# Patient Record
Sex: Female | Born: 1955 | Race: White | Hispanic: No | Marital: Married | Smoking: Former smoker
Health system: Southern US, Community
[De-identification: ages and names within clinical notes are randomized; demographics above are authoritative.]

## PROBLEM LIST (undated history)

## (undated) DIAGNOSIS — C801 Malignant (primary) neoplasm, unspecified: Secondary | ICD-10-CM

---

## 2013-05-09 ENCOUNTER — Emergency Department (HOSPITAL_BASED_OUTPATIENT_CLINIC_OR_DEPARTMENT_OTHER)
Admission: EM | Admit: 2013-05-09 | Discharge: 2013-05-09 | Disposition: A | Discharge status: Home / Self Care | Payer: Federal, State, Local not specified - PPO | Attending: Emergency Medicine | Admitting: Emergency Medicine

## 2013-05-09 ENCOUNTER — Encounter (HOSPITAL_BASED_OUTPATIENT_CLINIC_OR_DEPARTMENT_OTHER): Payer: Self-pay | Admitting: Emergency Medicine

## 2013-05-09 ENCOUNTER — Emergency Department (HOSPITAL_BASED_OUTPATIENT_CLINIC_OR_DEPARTMENT_OTHER): Payer: Federal, State, Local not specified - PPO

## 2013-05-09 DIAGNOSIS — J159 Unspecified bacterial pneumonia: Secondary | ICD-10-CM | POA: Insufficient documentation

## 2013-05-09 DIAGNOSIS — Z85819 Personal history of malignant neoplasm of unspecified site of lip, oral cavity, and pharynx: Secondary | ICD-10-CM | POA: Insufficient documentation

## 2013-05-09 DIAGNOSIS — J189 Pneumonia, unspecified organism: Secondary | ICD-10-CM

## 2013-05-09 DIAGNOSIS — Z87891 Personal history of nicotine dependence: Secondary | ICD-10-CM | POA: Insufficient documentation

## 2013-05-09 DIAGNOSIS — Z853 Personal history of malignant neoplasm of breast: Secondary | ICD-10-CM | POA: Insufficient documentation

## 2013-05-09 HISTORY — DX: Malignant (primary) neoplasm, unspecified: C80.1

## 2013-05-09 LAB — CBC WITH DIFFERENTIAL/PLATELET
BASOS ABS: 0 10*3/uL (ref 0.0–0.1)
Basophils Relative: 0 % (ref 0–1)
EOS PCT: 1 % (ref 0–5)
Eosinophils Absolute: 0.1 10*3/uL (ref 0.0–0.7)
HCT: 34.5 % — ABNORMAL LOW (ref 36.0–46.0)
Hemoglobin: 11.5 g/dL — ABNORMAL LOW (ref 12.0–15.0)
LYMPHS ABS: 1.5 10*3/uL (ref 0.7–4.0)
LYMPHS PCT: 15 % (ref 12–46)
MCH: 29.8 pg (ref 26.0–34.0)
MCHC: 33.3 g/dL (ref 30.0–36.0)
MCV: 89.4 fL (ref 78.0–100.0)
Monocytes Absolute: 1 10*3/uL (ref 0.1–1.0)
Monocytes Relative: 10 % (ref 3–12)
NEUTROS ABS: 7.4 10*3/uL (ref 1.7–7.7)
Neutrophils Relative %: 74 % (ref 43–77)
PLATELETS: 251 10*3/uL (ref 150–400)
RBC: 3.86 MIL/uL — AB (ref 3.87–5.11)
RDW: 13.5 % (ref 11.5–15.5)
WBC: 9.9 10*3/uL (ref 4.0–10.5)

## 2013-05-09 LAB — COMPREHENSIVE METABOLIC PANEL
ALK PHOS: 94 U/L (ref 39–117)
ALT: 19 U/L (ref 0–35)
AST: 32 U/L (ref 0–37)
Albumin: 4 g/dL (ref 3.5–5.2)
BILIRUBIN TOTAL: 0.4 mg/dL (ref 0.3–1.2)
BUN: 20 mg/dL (ref 6–23)
CALCIUM: 10.1 mg/dL (ref 8.4–10.5)
CHLORIDE: 97 meq/L (ref 96–112)
CO2: 26 meq/L (ref 19–32)
Creatinine, Ser: 1.1 mg/dL (ref 0.50–1.10)
GFR calc non Af Amer: 54 mL/min — ABNORMAL LOW (ref >90)
GFR, EST AFRICAN AMERICAN: 63 mL/min — AB (ref >90)
Glucose, Bld: 96 mg/dL (ref 70–99)
POTASSIUM: 4.5 meq/L (ref 3.7–5.3)
Sodium: 139 mEq/L (ref 137–147)
Total Protein: 7.8 g/dL (ref 6.0–8.3)

## 2013-05-09 LAB — TROPONIN I
Troponin I: <0.3 ng/mL (ref <0.30)
Troponin I: <0.3 ng/mL (ref <0.30)

## 2013-05-09 LAB — D-DIMER, QUANTITATIVE: D-Dimer, Quant: <0.27 ug/mL-FEU (ref 0.00–0.48)

## 2013-05-09 MED ORDER — ACETAMINOPHEN 325 MG PO TABS: 650.0000 mg | ORAL_TABLET | Freq: Four times a day (QID) | ORAL | Status: AC | PRN

## 2013-05-09 MED ORDER — LEVOFLOXACIN 750 MG PO TABS
ORAL_TABLET | ORAL | Status: AC
  Filled 2013-05-09: qty 1

## 2013-05-09 MED ORDER — SODIUM CHLORIDE 0.9 % IV BOLUS (SEPSIS)
1000.0000 mL | Freq: Once | INTRAVENOUS | Status: AC
  Administered 2013-05-09: 1000 mL via INTRAVENOUS

## 2013-05-09 MED ORDER — LEVOFLOXACIN IN D5W 750 MG/150ML IV SOLN: 750.0000 mg | Freq: Once | INTRAVENOUS | Status: DC

## 2013-05-09 MED ORDER — IOHEXOL 350 MG/ML SOLN
100.0000 mL | Freq: Once | INTRAVENOUS | Status: AC | PRN
Start: 2013-05-09 — End: 2013-05-09
  Administered 2013-05-09: 100 mL via INTRAVENOUS

## 2013-05-09 MED ORDER — LEVOFLOXACIN 750 MG PO TABS
750.0000 mg | ORAL_TABLET | Freq: Once | ORAL | Status: AC
  Administered 2013-05-09: 750 mg via ORAL

## 2013-05-09 MED ORDER — SODIUM CHLORIDE 0.9 % IV BOLUS (SEPSIS): 1000.0000 mL | Freq: Once | INTRAVENOUS | Status: DC

## 2013-05-09 MED ORDER — LEVOFLOXACIN 500 MG PO TABS: 500.0000 mg | ORAL_TABLET | Freq: Every day | ORAL | Status: AC

## 2013-05-09 MED ORDER — ASPIRIN 325 MG PO TABS
325.0000 mg | ORAL_TABLET | Freq: Once | ORAL | Status: AC
  Administered 2013-05-09: 325 mg via ORAL
  Filled 2013-05-09: qty 1

## 2013-05-09 NOTE — ED Notes (Signed)
States has felt achy all day especially in left rib cage area and says felt like she was getting the flu or something. Was driving home from work and began having pain on left side chest and feeling like she cant get a deep breath

## 2013-05-09 NOTE — ED Provider Notes (Signed)
CSN: [id number]     Arrival date & time 05/09/13  1229 History   First MD Initiated Contact with Patient 05/09/13 1230     Chief Complaint  Patient presents with  . Chest Pain  . Shortness of Breath     (Consider location/radiation/quality/duration/timing/severity/associated sxs/prior Treatment) The history is provided by the patient. No language interpreter was used.  Vicki Smith is a 58 y/o F with PMHx of breast cancer 10 years ago with chemotherapy and right mastectomy, throat cancer 2 years ago with radiation therapy presenting to the ED with chest pain that started suddenly while on her way home from work. Patient reported that the chest pain started to get progressively worse - stated that the pain worsens with deep inhalation. Described the discomfort as a stabbing pain localized to the left side of her chest without radiation. Stated that she felt fine prior to this afternoon. Denied history of HTN and DM. Reported that she quite smoking cigarettes 10 years ago. Denied fever, chills, neck pain, neck stiffness, nausea, vomiting, abdominal pain, jaw pain, blurred vision, sudden loss of vision, back pain.  PCP none   Past Medical History  Diagnosis Date  . Cancer     breast and throat   History reviewed. No pertinent past surgical history. History reviewed. No pertinent family history. History  Substance Use Topics  . Smoking status: Former Research scientist (life sciences)  . Smokeless tobacco: Not on file  . Alcohol Use: No   OB History   Grav Para Term Preterm Abortions TAB SAB Ect Mult Living                 Review of Systems  Constitutional: Negative for fever and chills.  Respiratory: Positive for shortness of breath. Negative for chest tightness.   Cardiovascular: Positive for chest pain.  Gastrointestinal: Negative for nausea, vomiting and abdominal pain.  Musculoskeletal: Negative for neck pain and neck stiffness.      Allergies  Review of patient's allergies indicates no known  allergies.  Home Medications   Prior to Admission medications   Not on File   BP 152/82  Pulse 69  Temp(Src) 100.8 F (38.2 C) (Oral)  Resp 20  Ht 5\' 10"  (1.778 m)  Wt 115 lb (52.164 kg)  BMI 16.50 kg/m2  SpO2 100% Physical Exam  Nursing note and vitals reviewed. Constitutional: She is oriented to person, place, and time. She appears well-developed and well-nourished. No distress.  Patient appears anxious and is tearful  HENT:  Mouth/Throat: Oropharynx is clear and moist. No oropharyngeal exudate.  Eyes: Conjunctivae and EOM are normal. Pupils are equal, round, and reactive to light. Right eye exhibits no discharge. Left eye exhibits no discharge.  Neck: Normal range of motion. Neck supple. No tracheal deviation present.  Cardiovascular: Normal rate, regular rhythm and normal heart sounds.  Exam reveals no friction rub.   No murmur heard. Cap refill < 3 seconds  Negative swelling or pitting edema localized to the lower extremities bilaterally   Pulmonary/Chest: Effort normal and breath sounds normal. No respiratory distress. She has no wheezes. She has no rales. She exhibits tenderness.  Patient is able to speak in full sentences without difficulty  Negative use of accessory muscles Negative stridor  Right breast mastectomy noted Discomfort upon palpation to the chest wall - left side - reproducible upon palpation    Musculoskeletal: Normal range of motion.  Full ROM to upper and lower extremities without difficulty noted, negative ataxia noted.  Lymphadenopathy:  She has no cervical adenopathy.  Neurological: She is alert and oriented to person, place, and time. No cranial nerve deficit. She exhibits normal muscle tone. Coordination normal.  Cranial nerves III-XII grossly intact Strength 5+/5+ to upper and lower extremities bilaterally with resistance applied, equal distribution noted  Skin: Skin is warm and dry. No rash noted. She is not diaphoretic. No erythema.   Psychiatric: She has a normal mood and affect. Her behavior is normal. Thought content normal.    ED Course  Procedures (including critical care time)  Results for orders placed during the hospital encounter of 05/09/13  CBC WITH DIFFERENTIAL      Result Value Ref Range   WBC 9.9  4.0 - 10.5 K/uL   RBC 3.86 (*) 3.87 - 5.11 MIL/uL   Hemoglobin 11.5 (*) 12.0 - 15.0 g/dL   HCT 34.5 (*) 36.0 - 46.0 %   MCV 89.4  78.0 - 100.0 fL   MCH 29.8  26.0 - 34.0 pg   MCHC 33.3  30.0 - 36.0 g/dL   RDW 13.5  11.5 - 15.5 %   Platelets 251  150 - 400 K/uL   Neutrophils Relative % 74  43 - 77 %   Neutro Abs 7.4  1.7 - 7.7 K/uL   Lymphocytes Relative 15  12 - 46 %   Lymphs Abs 1.5  0.7 - 4.0 K/uL   Monocytes Relative 10  3 - 12 %   Monocytes Absolute 1.0  0.1 - 1.0 K/uL   Eosinophils Relative 1  0 - 5 %   Eosinophils Absolute 0.1  0.0 - 0.7 K/uL   Basophils Relative 0  0 - 1 %   Basophils Absolute 0.0  0.0 - 0.1 K/uL  COMPREHENSIVE METABOLIC PANEL      Result Value Ref Range   Sodium 139  137 - 147 mEq/L   Potassium 4.5  3.7 - 5.3 mEq/L   Chloride 97  96 - 112 mEq/L   CO2 26  19 - 32 mEq/L   Glucose, Bld 96  70 - 99 mg/dL   BUN 20  6 - 23 mg/dL   Creatinine, Ser 1.10  0.50 - 1.10 mg/dL   Calcium 10.1  8.4 - 10.5 mg/dL   Total Protein 7.8  6.0 - 8.3 g/dL   Albumin 4.0  3.5 - 5.2 g/dL   AST 32  0 - 37 U/L   ALT 19  0 - 35 U/L   Alkaline Phosphatase 94  39 - 117 U/L   Total Bilirubin 0.4  0.3 - 1.2 mg/dL   GFR calc non Af Amer 54 (*) >90 mL/min   GFR calc Af Amer 63 (*) >90 mL/min  TROPONIN I      Result Value Ref Range   Troponin I <0.30  <0.30 ng/mL  D-DIMER, QUANTITATIVE      Result Value Ref Range   D-Dimer, Quant <0.27  0.00 - 0.48 ug/mL-FEU  TROPONIN I      Result Value Ref Range   Troponin I <0.30  <0.30 ng/mL    Labs Review Labs Reviewed  CBC WITH DIFFERENTIAL - Abnormal; Notable for the following:    RBC 3.86 (*)    Hemoglobin 11.5 (*)    HCT 34.5 (*)    All other  components within normal limits  COMPREHENSIVE METABOLIC PANEL - Abnormal; Notable for the following:    GFR calc non Af Amer 54 (*)    GFR calc Af Amer 63 (*)  All other components within normal limits  TROPONIN I  D-DIMER, QUANTITATIVE  TROPONIN I    Imaging Review Dg Chest 2 View  05/09/2013   CLINICAL DATA:  58 year old female sudden onset chest pain and shortness of Breath while driving. Initial encounter. History of breast and throat cancer status post mastectomy and radiation. Initial encounter.  EXAM: CHEST  2 VIEW  COMPARISON:  [facility] chest CT 02/11/2007.  FINDINGS: Lung volumes are at the upper limits of normal. Pectus excavatum again noted. Normal cardiac size and mediastinal contours. Visualized tracheal air column is within normal limits.  Interval surgery to the right chest wall. There is a chronic posterior lateral right sixth rib fracture. Bilateral axilla postoperative changes with small surgical clips. No acute osseous abnormality identified.  There are multiple nodular densities in the left lung, the larger measuring about 3 cm projects just lateral and inferior to the left hilum (double arrows). There are also biapical nodular changes, greater on the left (2 cm, arrow). These findings are not evident on the lateral view. Questionable additional right lower lobe peribronchovascular nodularity (small arrow). No pneumothorax or edema. No pleural effusion.  IMPRESSION: 1. Left greater than right multifocal nodular opacity in the lungs, new since 2009. Recommend followup chest CT (IV contrast preferred). 2. No other acute cardiopulmonary abnormality identified.   Electronically Signed   By: Lars Pinks M.D.   On: 05/09/2013 13:30   Ct Chest W Contrast  05/09/2013   CLINICAL DATA:  Chest pain and shortness of breath. New multifocal nodular opacities on chest radiographs. History of breast cancer and throat cancer.  EXAM: CT CHEST WITH CONTRAST  TECHNIQUE:  Multidetector CT imaging of the chest was performed during intravenous contrast administration.  CONTRAST:  126mL OMNIPAQUE IOHEXOL 350 MG/ML SOLN  COMPARISON:  Chest radiographs 05/09/2013 and CT 02/11/2007  FINDINGS: Sequelae of bilateral mastectomies and axillary dissections are identified. There is marked pectus excavatum deformity. No enlarged axillary, mediastinal, or hilar lymph nodes are identified. Thoracic aorta is normal in caliber. Heart is normal in size. There is no pleural or pericardial effusion.  There is new consolidative and ground-glass opacity in the lung apices, left greater than right. There is a 6 mm ground-glass nodular opacity along the right minor fissure (image 25). 1.4 cm nodular opacity is present in the basilar right lower lobe (image 50). 3 mm nodule in the left upper lobe is unchanged (image 23). There are scattered peribronchovascular ground-glass and consolidative opacities bilaterally, with greatest involvement of the lower lobes and lingula.  Visualized portion of the upper abdomen is unremarkable. No acute osseous abnormality is identified. Old right lateral sixth rib fracture is noted.  IMPRESSION: New bilateral peribronchovascular opacities, most concerning for acute multifocal pneumonia. Follow-up chest CT is recommended in 4 weeks following treatment to assess for resolution.   Electronically Signed   By: Logan Bores   On: 05/09/2013 15:44     EKG Interpretation   Date/Time:  Friday May 09 2013 12:33:39 EDT Ventricular Rate:  88 PR Interval:  168 QRS Duration: 100 QT Interval:  358 QTC Calculation: 433 R Axis:   104 Text Interpretation:  Normal sinus rhythm Biatrial enlargement Rightward  axis Incomplete right bundle branch block Possible Anterior infarct , age  undetermined No previous tracing Confirmed by Maryan Rued  MD, WHITNEY  [phone]) on 05/09/2013 12:40:35 PM      MDM   Final diagnoses:  Community acquired pneumonia   Medications  aspirin tablet  325  mg (325 mg Oral Given 05/09/13 1244)  sodium chloride 0.9 % bolus 1,000 mL (0 mLs Intravenous Stopped 05/09/13 1610)  iohexol (OMNIPAQUE) 350 MG/ML injection 100 mL (100 mLs Intravenous Contrast Given 05/09/13 1519)  levofloxacin (LEVAQUIN) tablet 750 mg (750 mg Oral Given 05/09/13 1759)   Filed Vitals:   05/09/13 1320 05/09/13 1327 05/09/13 1800 05/09/13 1803  BP:  107/62 152/82   Pulse: 74 73 69   Temp:      TempSrc:      Resp: 20 23 20    Height:      Weight:      SpO2: 100% 90% 100% 100%    EKG noted normal sinus rhythm with a heart rate of 88 bpm - bilateral enlargement noted with right axis deviation with an incomplete RBBB - no previous tracings noted. First troponin negative elevation. Second troponin negative elevation. D-dimer negative elevation. CMP negative findings - kidney and liver functioning well. Chest xray noted left greater than right multifocal nodular opacity in the lungs, new since 2009 - CT chest with contrast noted. CT chest with contrast noted new bilateral peribronchial vascular opacities are most concerning for acute multifocal pneumonia. Upon arrival to the ED patient was mildly afebrile - 100.65F. IV fluids, by mouth Tylenol and by mouth antibiotics administered in ED setting as per patient's request. Patient appears well. Negative signs of sepsis-negative elevated white blood cell count leukocytosis, negative tachycardia, negative tachypnea, pulse ox 100% on room air and with ambulation, negative drop in blood pressure. Patient stable, afebrile. Patient not septic appearing. No sign of respiratory distress. Patient denied chest pain, shortness of breath, difficulty breathing while in the ED setting. Curb 65 1 - low risk. PORT score 48 - outpatient treatment reasonable. Discharged patient. Discharged patient with antibiotics and Tylenol for fever control. Discussed with patient that CT chest with contrast is to be repeated within 4 weeks as per requested from today's CT of  chest. Discussed with patient to rest and stay hydrated. Discussed with patient to avoid any physical or strenuous activity. Referred patient to PCP to be reassessed. Discussed with patient to closely monitor symptoms and if symptoms are to worsen or change to report back to the ED - strict return instructions given.  Patient agreed to plan of care, understood, all questions answered.   Jamse Mead, PA-C 05/10/13 1341  Eliberto Sole, PA-C 05/11/13 2016

## 2013-05-09 NOTE — Discharge Instructions (Signed)
Please call your doctor for a followup appointment within 24-48 hours. When you talk to your doctor please let them know that you were seen in the emergency department and have them acquire all of your records so that they can discuss the findings with you and formulate a treatment plan to fully care for your new and ongoing problems. Please call and 7 appointment with your primary care provider to be reassessed within the next week For fever control please use Tylenol as prescribed-no more than 4000 mg/4 g per day for this can lead to liver issues and Tylenol overdose they can be fatal CT chest will need to be performed within 4 weeks Please drink plenty of water Please rest Please continue monitor symptoms closely and if symptoms are to worsen or change (fever greater than 121, chills, chest pain, shortness of breath, difficulty breathing, worsening or changes to chest pain, fainting, weakness, blood in the sputum) please report back to the ED immediately  Pneumonia, Adult Pneumonia is an infection of the lungs.  CAUSES Pneumonia may be caused by bacteria or a virus. Usually, these infections are caused by breathing infectious particles into the lungs (respiratory tract). SYMPTOMS   Cough.  Fever.  Chest pain.  Increased rate of breathing.  Wheezing.  Mucus production. DIAGNOSIS  If you have the common symptoms of pneumonia, your caregiver will typically confirm the diagnosis with a chest X-ray. The X-ray will show an abnormality in the lung (pulmonary infiltrate) if you have pneumonia. Other tests of your blood, urine, or sputum may be done to find the specific cause of your pneumonia. Your caregiver may also do tests (blood gases or pulse oximetry) to see how well your lungs are working. TREATMENT  Some forms of pneumonia may be spread to other people when you cough or sneeze. You may be asked to wear a mask before and during your exam. Pneumonia that is caused by bacteria is treated  with antibiotic medicine. Pneumonia that is caused by the influenza virus may be treated with an antiviral medicine. Most other viral infections must run their course. These infections will not respond to antibiotics.  PREVENTION A pneumococcal shot (vaccine) is available to prevent a common bacterial cause of pneumonia. This is usually suggested for:  People over 78 years old.  Patients on chemotherapy.  People with chronic lung problems, such as bronchitis or emphysema.  People with immune system problems. If you are over 65 or have a high risk condition, you may receive the pneumococcal vaccine if you have not received it before. In some countries, a routine influenza vaccine is also recommended. This vaccine can help prevent some cases of pneumonia.You may be offered the influenza vaccine as part of your care. If you smoke, it is time to quit. You may receive instructions on how to stop smoking. Your caregiver can provide medicines and counseling to help you quit. HOME CARE INSTRUCTIONS   Cough suppressants may be used if you are losing too much rest. However, coughing protects you by clearing your lungs. You should avoid using cough suppressants if you can.  Your caregiver may have prescribed medicine if he or she thinks your pneumonia is caused by a bacteria or influenza. Finish your medicine even if you start to feel better.  Your caregiver may also prescribe an expectorant. This loosens the mucus to be coughed up.  Only take over-the-counter or prescription medicines for pain, discomfort, or fever as directed by your caregiver.  Do not smoke. Smoking  is a common cause of bronchitis and can contribute to pneumonia. If you are a smoker and continue to smoke, your cough may last several weeks after your pneumonia has cleared.  A cold steam vaporizer or humidifier in your room or home may help loosen mucus.  Coughing is often worse at night. Sleeping in a semi-upright position in a  recliner or using a couple pillows under your head will help with this.  Get rest as you feel it is needed. Your body will usually let you know when you need to rest. SEEK IMMEDIATE MEDICAL CARE IF:   Your illness becomes worse. This is especially true if you are elderly or weakened from any other disease.  You cannot control your cough with suppressants and are losing sleep.  You begin coughing up blood.  You develop pain which is getting worse or is uncontrolled with medicines.  You have a fever.  Any of the symptoms which initially brought you in for treatment are getting worse rather than better.  You develop shortness of breath or chest pain. MAKE SURE YOU:   Understand these instructions.  Will watch your condition.  Will get help right away if you are not doing well or get worse. Document Released: 12/19/2004 Document Revised: 03/13/2011 Document Reviewed: 03/10/2010 [facility] Patient Information 2014 [place], [place].

## 2013-05-12 NOTE — ED Provider Notes (Signed)
Medical screening examination/treatment/procedure(s) were performed by non-physician practitioner and as supervising physician I was immediately available for consultation/collaboration.   EKG Interpretation   Date/Time:  Friday May 09 2013 12:33:39 EDT Ventricular Rate:  88 PR Interval:  168 QRS Duration: 100 QT Interval:  358 QTC Calculation: 433 R Axis:   104 Text Interpretation:  Normal sinus rhythm Biatrial enlargement Rightward  axis Incomplete right bundle branch block Possible Anterior infarct , age  undetermined No previous tracing Confirmed by Maryan Rued  MD, Precious Gilchrest  [phone]) on 05/09/2013 12:40:35 PM        Blanchie Dessert, MD 05/12/13 [phone]

## 2015-02-13 IMAGING — CT CT CHEST W/ CM
2 of 3 series · 15 of 36 positions shown, 18 images · IV contrast (APPLIED)
Comparison: Chest radiographs 05/09/2013 and CT 02/11/2007

CLINICAL DATA: Chest pain and shortness of breath. New multifocal
nodular opacities on chest radiographs. History of breast cancer and
throat cancer.

EXAM:
CT CHEST WITH CONTRAST
TECHNIQUE: Multidetector CT imaging of the chest was performed during
intravenous contrast administration.
CONTRAST:  100mL OMNIPAQUE IOHEXOL 350 MG/ML SOLN

[Series 2: chest 5.0 b31f · axial · 0.61mm/px · z∈[+1290,+1545]mm · 12 of 61 slices shown, 15 images]
[im 5/61  mediastinal]
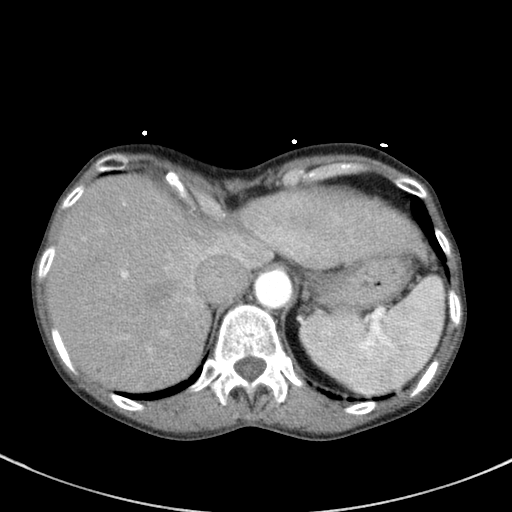
[im 5/61  lung]
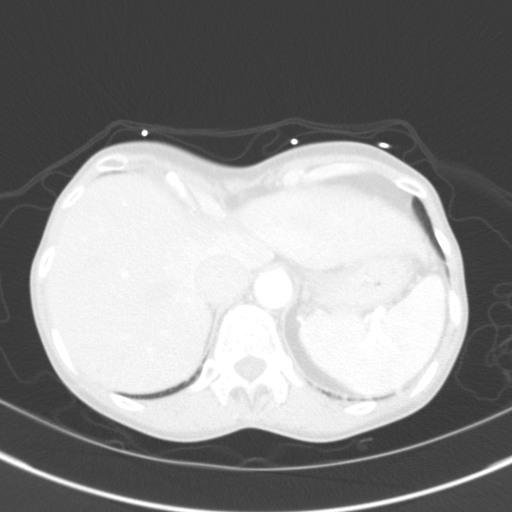
[im 9/61  lung]
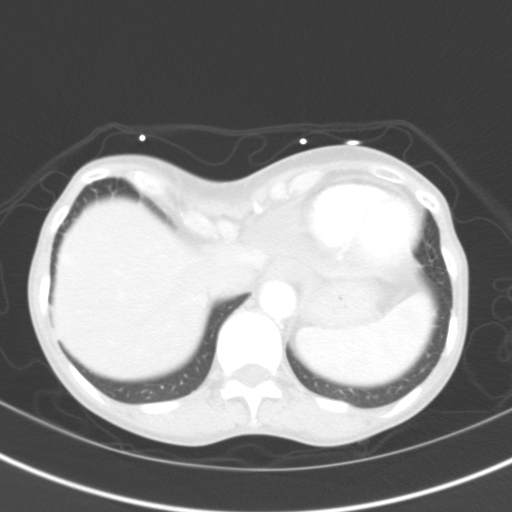
[im 14/61  lung]
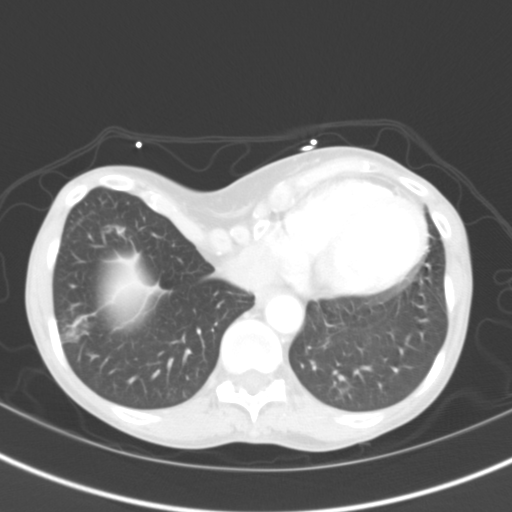
[im 18/61  lung]
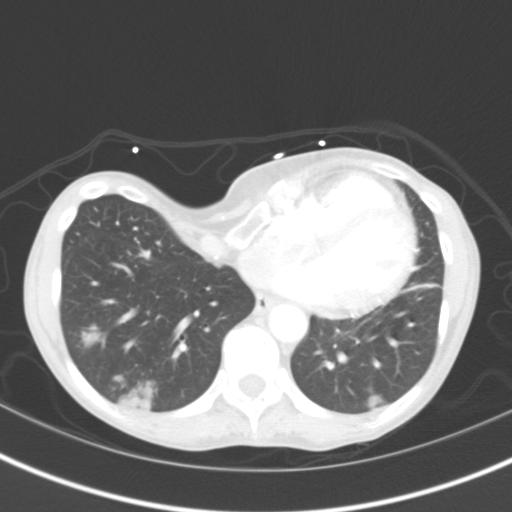
[im 23/61  mediastinal]
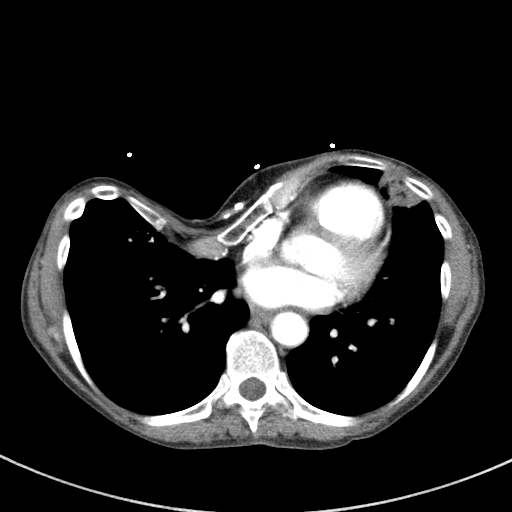
[im 23/61  lung]
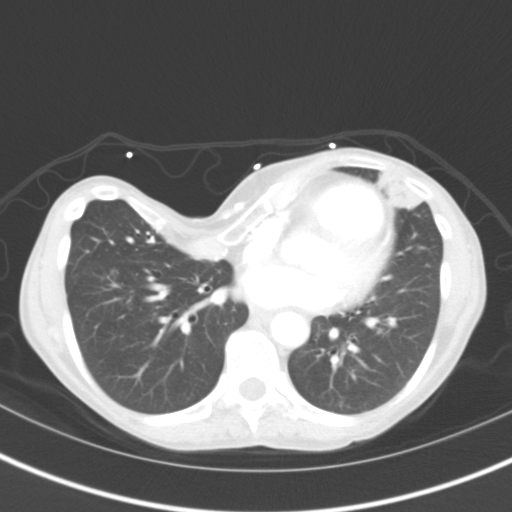
[im 27/61  lung]
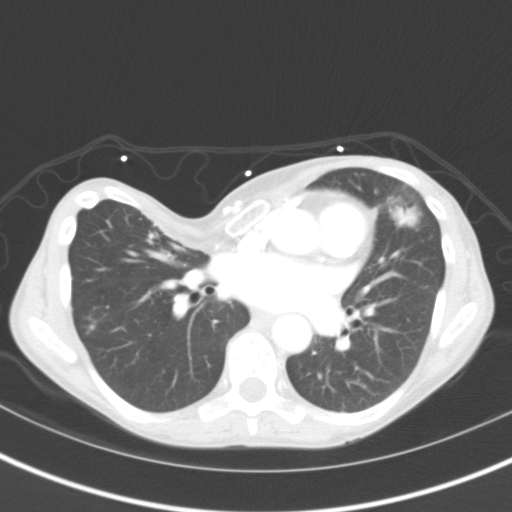
[im 34/61  lung]
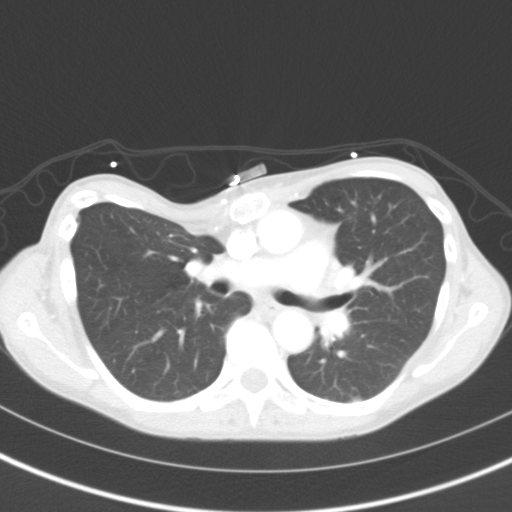
[im 38/61  lung]
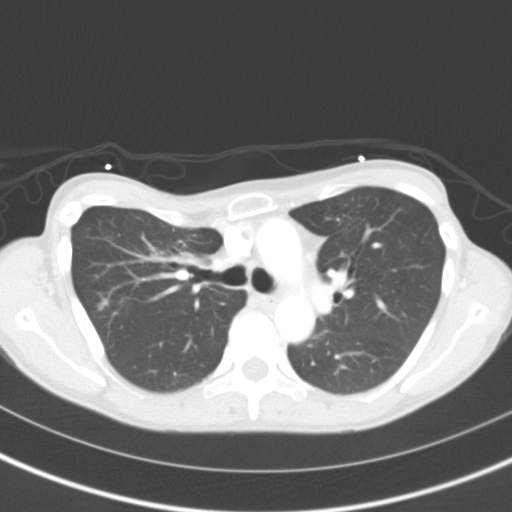
[im 43/61  mediastinal]
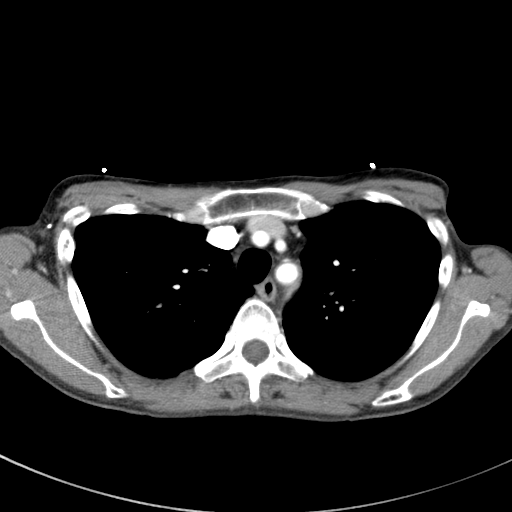
[im 43/61  lung]
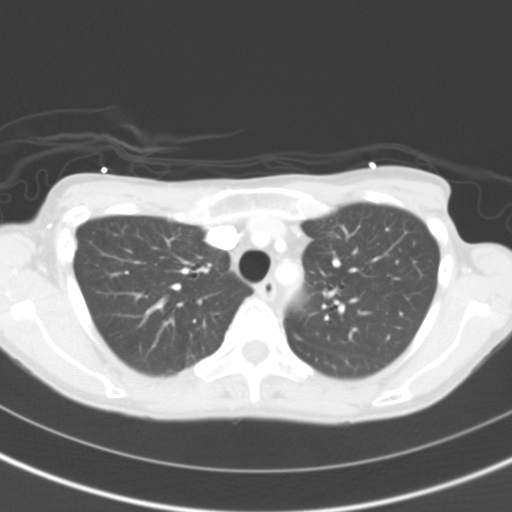
[im 47/61  lung]
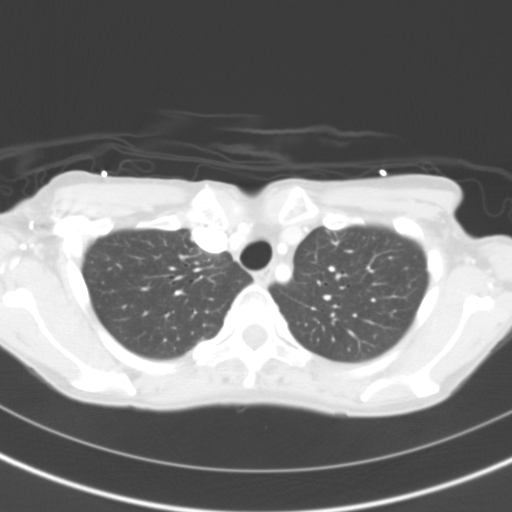
[im 52/61  lung]
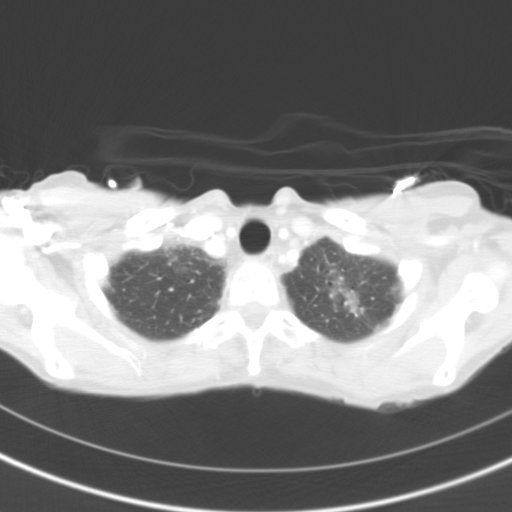
[im 56/61  lung]
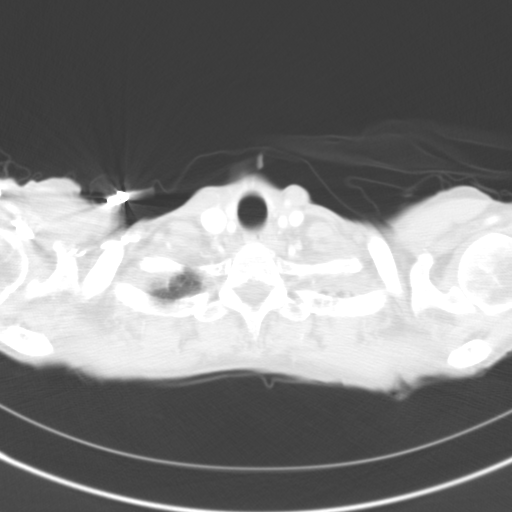

[Series 6: chest 3.0 coronal · coronal · 0.67mm/px · 3 of 58 slices shown]
[im 12/58  lung]
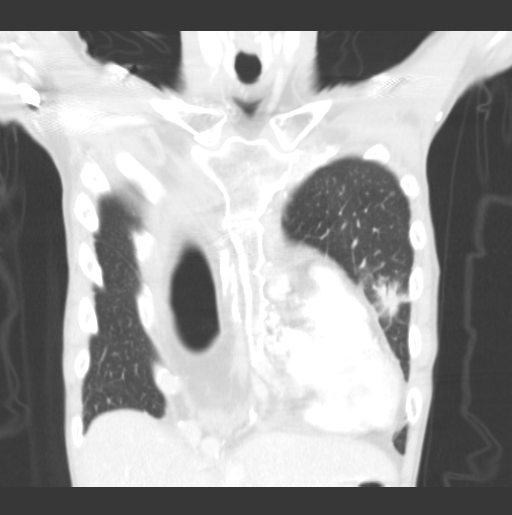
[im 23/58  lung]
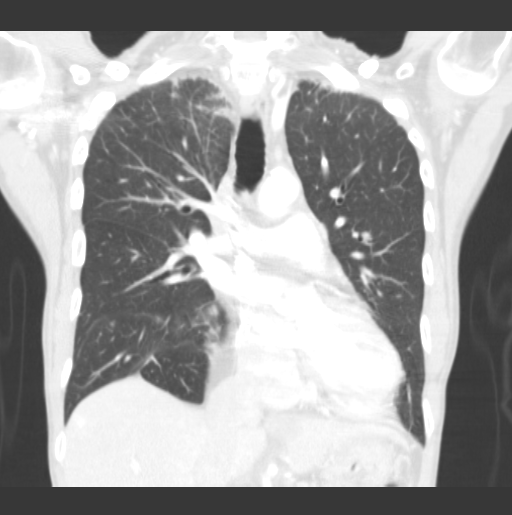
[im 35/58  lung]
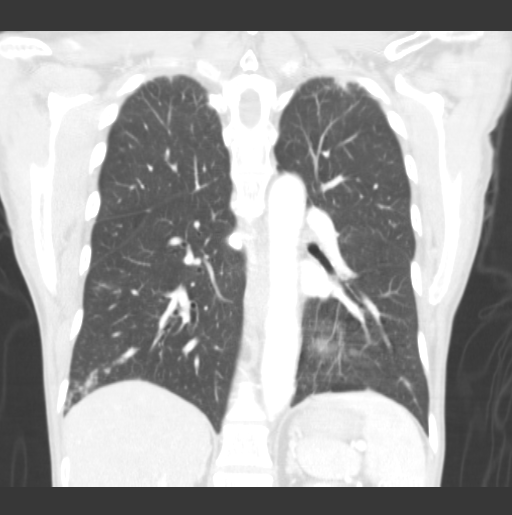

[15 of 36 positions shown; findings below may reference images not displayed]

FINDINGS: Sequelae of bilateral mastectomies and axillary dissections are
identified. There is marked pectus excavatum deformity. No enlarged
axillary, mediastinal, or hilar lymph nodes are identified. Thoracic
aorta is normal in caliber. Heart is normal in size. There is no
pleural or pericardial effusion.

There is new consolidative and ground-glass opacity in the lung
apices, left greater than right. There is a 6 mm ground-glass
nodular opacity along the right minor fissure (image 25). 1.4 cm
nodular opacity is present in the basilar right lower lobe (image
50). 3 mm nodule in the left upper lobe is unchanged (image 23).
There are scattered peribronchovascular ground-glass and
consolidative opacities bilaterally, with greatest involvement of
the lower lobes and lingula.

Visualized portion of the upper abdomen is unremarkable. No acute
osseous abnormality is identified. Old right lateral sixth rib
fracture is noted.
IMPRESSION: New bilateral peribronchovascular opacities, most concerning for
acute multifocal pneumonia. Follow-up chest CT is recommended in 4
weeks following treatment to assess for resolution.
